# Patient Record
Sex: Male | Born: 1995 | Hispanic: Yes | Marital: Single | Smoking: Current some day smoker
Health system: Southern US, Community
[De-identification: ages and names within clinical notes are randomized; demographics above are authoritative.]

## PROBLEM LIST (undated history)

## (undated) DIAGNOSIS — S139XXA Sprain of joints and ligaments of unspecified parts of neck, initial encounter: Secondary | ICD-10-CM

## (undated) DIAGNOSIS — S335XXA Sprain of ligaments of lumbar spine, initial encounter: Secondary | ICD-10-CM

## (undated) DIAGNOSIS — J45909 Unspecified asthma, uncomplicated: Secondary | ICD-10-CM

## (undated) HISTORY — DX: Sprain of ligaments of lumbar spine, initial encounter: S33.5XXA

## (undated) HISTORY — DX: Sprain of joints and ligaments of unspecified parts of neck, initial encounter: S13.9XXA

---

## 2013-09-20 ENCOUNTER — Emergency Department (HOSPITAL_COMMUNITY): Payer: No Typology Code available for payment source

## 2013-09-20 ENCOUNTER — Emergency Department (HOSPITAL_COMMUNITY)
Admission: EM | Admit: 2013-09-20 | Discharge: 2013-09-20 | Disposition: A | Discharge status: Home / Self Care | Payer: No Typology Code available for payment source | Attending: Emergency Medicine | Admitting: Emergency Medicine

## 2013-09-20 ENCOUNTER — Encounter (HOSPITAL_COMMUNITY): Payer: Self-pay | Admitting: Emergency Medicine

## 2013-09-20 DIAGNOSIS — H7290 Unspecified perforation of tympanic membrane, unspecified ear: Secondary | ICD-10-CM

## 2013-09-20 DIAGNOSIS — F17200 Nicotine dependence, unspecified, uncomplicated: Secondary | ICD-10-CM | POA: Insufficient documentation

## 2013-09-20 DIAGNOSIS — Y92410 Unspecified street and highway as the place of occurrence of the external cause: Secondary | ICD-10-CM | POA: Insufficient documentation

## 2013-09-20 DIAGNOSIS — S161XXA Strain of muscle, fascia and tendon at neck level, initial encounter: Secondary | ICD-10-CM

## 2013-09-20 DIAGNOSIS — S29019A Strain of muscle and tendon of unspecified wall of thorax, initial encounter: Secondary | ICD-10-CM

## 2013-09-20 DIAGNOSIS — H9190 Unspecified hearing loss, unspecified ear: Secondary | ICD-10-CM | POA: Insufficient documentation

## 2013-09-20 DIAGNOSIS — S139XXA Sprain of joints and ligaments of unspecified parts of neck, initial encounter: Secondary | ICD-10-CM | POA: Insufficient documentation

## 2013-09-20 DIAGNOSIS — J45909 Unspecified asthma, uncomplicated: Secondary | ICD-10-CM | POA: Insufficient documentation

## 2013-09-20 DIAGNOSIS — Y9389 Activity, other specified: Secondary | ICD-10-CM | POA: Insufficient documentation

## 2013-09-20 DIAGNOSIS — S239XXA Sprain of unspecified parts of thorax, initial encounter: Secondary | ICD-10-CM | POA: Insufficient documentation

## 2013-09-20 DIAGNOSIS — S0990XA Unspecified injury of head, initial encounter: Secondary | ICD-10-CM | POA: Insufficient documentation

## 2013-09-20 HISTORY — DX: Unspecified asthma, uncomplicated: J45.909

## 2013-09-20 MED ORDER — ONDANSETRON 4 MG PO TBDP
4.0000 mg | ORAL_TABLET | Freq: Once | ORAL | Status: AC
  Administered 2013-09-20: 4 mg via ORAL
  Filled 2013-09-20: qty 1

## 2013-09-20 MED ORDER — OXYCODONE-ACETAMINOPHEN 5-325 MG PO TABS
2.0000 | ORAL_TABLET | Freq: Once | ORAL | Status: AC
  Administered 2013-09-20: 2 via ORAL
  Filled 2013-09-20: qty 2

## 2013-09-20 MED ORDER — OXYCODONE-ACETAMINOPHEN 5-325 MG PO TABS: 1.0000 | ORAL_TABLET | Freq: Four times a day (QID) | ORAL | Status: AC | PRN

## 2013-09-20 NOTE — Discharge Instructions (Signed)
X-rays are all normal, you do have a ruptured eardrum.  This should heal without incident.  Please try to avoid getting any water or fluid.  In your ear.  Do not try to senior year with a Q-tip.  Please make an appointment for further evaluation by Dr. Annalee GentaShoemaker.  Next week.
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
## 2013-09-21 NOTE — ED Provider Notes (Signed)
Medical screening examination/treatment/procedure(s) were performed by non-physician practitioner and as supervising physician I was immediately available for consultation/collaboration.  EKG Interpretation   None         Taelar Gronewold T Awa Bachicha, MD 09/21/13 1801 

## 2013-09-26 ENCOUNTER — Encounter: Payer: Self-pay | Admitting: Neurology

## 2013-09-26 ENCOUNTER — Ambulatory Visit (INDEPENDENT_AMBULATORY_CARE_PROVIDER_SITE_OTHER): Payer: Self-pay | Admitting: Neurology

## 2013-09-26 VITALS — BP 136/77 | HR 91 | Ht 65.5 in | Wt 236.0 lb

## 2013-09-26 DIAGNOSIS — S139XXA Sprain of joints and ligaments of unspecified parts of neck, initial encounter: Secondary | ICD-10-CM

## 2013-09-26 DIAGNOSIS — S339XXA Sprain of unspecified parts of lumbar spine and pelvis, initial encounter: Secondary | ICD-10-CM

## 2013-09-26 DIAGNOSIS — S335XXA Sprain of ligaments of lumbar spine, initial encounter: Secondary | ICD-10-CM

## 2013-09-26 HISTORY — DX: Sprain of unspecified parts of lumbar spine and pelvis, initial encounter: S33.9XXA

## 2013-09-26 HISTORY — DX: Sprain of joints and ligaments of unspecified parts of neck, initial encounter: S13.9XXA

## 2013-09-26 MED ORDER — IBUPROFEN 800 MG PO TABS: 800.0000 mg | ORAL_TABLET | Freq: Three times a day (TID) | ORAL | Status: AC

## 2013-09-26 MED ORDER — CYCLOBENZAPRINE HCL 10 MG PO TABS: 10.0000 mg | ORAL_TABLET | Freq: Three times a day (TID) | ORAL | Status: AC
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
## 2013-09-26 NOTE — Patient Instructions (Signed)
Back Exercises  Back exercises help treat and prevent back injuries. The goal is to increase your strength in your belly (abdominal) and back muscles. These exercises can also help with flexibility. Start these exercises when told by your doctor.  HOME CARE  Back exercises include:  Pelvic Tilt.  · Lie on your back with your knees bent. Tilt your pelvis until the lower part of your back is against the floor. Hold this position 5 to 10 sec. Repeat this exercise 5 to 10 times.  Knee to Chest.  · Pull 1 knee up against your chest and hold for 20 to 30 seconds. Repeat this with the other knee. This may be done with the other leg straight or bent, whichever feels better. Then, pull both knees up against your chest.  Sit-Ups or Curl-Ups.  · Bend your knees 90 degrees. Start with tilting your pelvis, and do a partial, slow sit-up. Only lift your upper half 30 to 45 degrees off the floor. Take at least 2 to 3 seonds for each sit-up. Do not do sit-ups with your knees out straight. If partial sit-ups are difficult, simply do the above but with only tightening your belly (abdominal) muscles and holding it as told.  Hip-Lift.  · Lie on your back with your knees flexed 90 degrees. Push down with your feet and shoulders as you raise your hips 2 inches off the floor. Hold for 10 seconds, repeat 5 to 10 times.  Back Arches.  · Lie on your stomach. Prop yourself up on bent elbows. Slowly press on your hands, causing an arch in your low back. Repeat 3 to 5 times.  Shoulder-Lifts.  · Lie face down with arms beside your body. Keep hips and belly pressed to floor as you slowly lift your head and shoulders off the floor.  Do not overdo your exercises. Be careful in the beginning. Exercises may cause you some mild back discomfort. If the pain lasts for more than 15 minutes, stop the exercises until you see your doctor. Improvement with exercise for back problems is slow.   Document Released: 08/27/2010 Document Revised: 10/17/2011  Document Reviewed: 05/26/2011  [employer]® Patient Information ©2014 [employer].

## 2013-12-17 ENCOUNTER — Encounter: Payer: Self-pay | Admitting: Neurology

## 2014-03-19 ENCOUNTER — Ambulatory Visit: Payer: Self-pay | Admitting: Neurology
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
